# Patient Record
Sex: Male | Born: 1990 | Race: Black or African American | Hispanic: No | Marital: Single | Smoking: Never smoker
Health system: Southern US, Community
[De-identification: ages and names within clinical notes are randomized; demographics above are authoritative.]

---
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
## 2019-05-05 ENCOUNTER — Other Ambulatory Visit: Payer: Self-pay

## 2019-05-05 ENCOUNTER — Emergency Department (HOSPITAL_COMMUNITY)
Admission: EM | Admit: 2019-05-05 | Discharge: 2019-05-05 | Disposition: A | Discharge status: Home / Self Care | Payer: Commercial Managed Care - PPO | Attending: Emergency Medicine | Admitting: Emergency Medicine

## 2019-05-05 ENCOUNTER — Encounter (HOSPITAL_COMMUNITY): Payer: Self-pay | Admitting: Emergency Medicine

## 2019-05-05 ENCOUNTER — Emergency Department (HOSPITAL_COMMUNITY): Payer: Commercial Managed Care - PPO

## 2019-05-05 DIAGNOSIS — B349 Viral infection, unspecified: Secondary | ICD-10-CM | POA: Diagnosis not present

## 2019-05-05 DIAGNOSIS — Z20828 Contact with and (suspected) exposure to other viral communicable diseases: Secondary | ICD-10-CM | POA: Insufficient documentation

## 2019-05-05 DIAGNOSIS — E876 Hypokalemia: Secondary | ICD-10-CM | POA: Insufficient documentation

## 2019-05-05 DIAGNOSIS — N3000 Acute cystitis without hematuria: Secondary | ICD-10-CM | POA: Diagnosis not present

## 2019-05-05 DIAGNOSIS — Z20822 Contact with and (suspected) exposure to covid-19: Secondary | ICD-10-CM

## 2019-05-05 DIAGNOSIS — R1084 Generalized abdominal pain: Secondary | ICD-10-CM | POA: Diagnosis not present

## 2019-05-05 DIAGNOSIS — R509 Fever, unspecified: Secondary | ICD-10-CM | POA: Diagnosis present

## 2019-05-05 LAB — URINALYSIS, ROUTINE W REFLEX MICROSCOPIC
Glucose, UA: NEGATIVE mg/dL
Hgb urine dipstick: NEGATIVE
Ketones, ur: 80 mg/dL — AB
Leukocytes,Ua: NEGATIVE
Nitrite: NEGATIVE
Protein, ur: >=300 mg/dL — AB
Specific Gravity, Urine: 1.041 — ABNORMAL HIGH (ref 1.005–1.030)
pH: 5 (ref 5.0–8.0)

## 2019-05-05 LAB — CBC
HCT: 42.8 % (ref 39.0–52.0)
Hemoglobin: 14.5 g/dL (ref 13.0–17.0)
MCH: 24.5 pg — ABNORMAL LOW (ref 26.0–34.0)
MCHC: 33.9 g/dL (ref 30.0–36.0)
MCV: 72.4 fL — ABNORMAL LOW (ref 80.0–100.0)
Platelets: 108 10*3/uL — ABNORMAL LOW (ref 150–400)
RBC: 5.91 MIL/uL — ABNORMAL HIGH (ref 4.22–5.81)
RDW: 14.5 % (ref 11.5–15.5)
WBC: 3.7 10*3/uL — ABNORMAL LOW (ref 4.0–10.5)
nRBC: 0 % (ref 0.0–0.2)

## 2019-05-05 LAB — COMPREHENSIVE METABOLIC PANEL
ALT: 28 U/L (ref 0–44)
AST: 58 U/L — ABNORMAL HIGH (ref 15–41)
Albumin: 4 g/dL (ref 3.5–5.0)
Alkaline Phosphatase: 54 U/L (ref 38–126)
Anion gap: 13 (ref 5–15)
BUN: 8 mg/dL (ref 6–20)
CO2: 20 mmol/L — ABNORMAL LOW (ref 22–32)
Calcium: 9.1 mg/dL (ref 8.9–10.3)
Chloride: 102 mmol/L (ref 98–111)
Creatinine, Ser: 1.04 mg/dL (ref 0.61–1.24)
GFR calc Af Amer: >60 mL/min (ref >60)
GFR calc non Af Amer: >60 mL/min (ref >60)
Glucose, Bld: 121 mg/dL — ABNORMAL HIGH (ref 70–99)
Potassium: 3.1 mmol/L — ABNORMAL LOW (ref 3.5–5.1)
Sodium: 135 mmol/L (ref 135–145)
Total Bilirubin: 0.8 mg/dL (ref 0.3–1.2)
Total Protein: 7.3 g/dL (ref 6.5–8.1)

## 2019-05-05 LAB — LIPASE, BLOOD: Lipase: 42 U/L (ref 11–51)

## 2019-05-05 LAB — MAGNESIUM: Magnesium: 1.9 mg/dL (ref 1.7–2.4)

## 2019-05-05 LAB — SARS CORONAVIRUS 2 (TAT 6-24 HRS): SARS Coronavirus 2: NEGATIVE

## 2019-05-05 MED ORDER — CEPHALEXIN 500 MG PO CAPS: 500.0000 mg | ORAL_CAPSULE | Freq: Two times a day (BID) | ORAL | 0 refills | Status: AC

## 2019-05-05 MED ORDER — ONDANSETRON HCL 4 MG/2ML IJ SOLN
4.0000 mg | Freq: Once | INTRAMUSCULAR | Status: AC
  Administered 2019-05-05: 4 mg via INTRAVENOUS
  Filled 2019-05-05: qty 2

## 2019-05-05 MED ORDER — SODIUM CHLORIDE 0.9 % IV SOLN
1.0000 g | Freq: Once | INTRAVENOUS | Status: AC
  Administered 2019-05-05: 1 g via INTRAVENOUS
  Filled 2019-05-05: qty 10

## 2019-05-05 MED ORDER — ONDANSETRON 4 MG PO TBDP: 4.0000 mg | ORAL_TABLET | Freq: Three times a day (TID) | ORAL | 0 refills | Status: AC | PRN

## 2019-05-05 MED ORDER — POTASSIUM CHLORIDE CRYS ER 20 MEQ PO TBCR
40.0000 meq | EXTENDED_RELEASE_TABLET | Freq: Once | ORAL | Status: AC
  Administered 2019-05-05: 40 meq via ORAL
  Filled 2019-05-05: qty 2

## 2019-05-05 MED ORDER — ACETAMINOPHEN 325 MG PO TABS
650.0000 mg | ORAL_TABLET | Freq: Once | ORAL | Status: AC
  Administered 2019-05-05: 650 mg via ORAL
  Filled 2019-05-05: qty 2

## 2019-05-05 MED ORDER — LACTATED RINGERS IV BOLUS
1000.0000 mL | Freq: Once | INTRAVENOUS | Status: AC
  Administered 2019-05-05: 1000 mL via INTRAVENOUS

## 2019-05-05 MED ORDER — SODIUM CHLORIDE 0.9% FLUSH: 3.0000 mL | Freq: Once | INTRAVENOUS | Status: DC

## 2019-05-05 MED ORDER — POTASSIUM CHLORIDE CRYS ER 20 MEQ PO TBCR: 20.0000 meq | EXTENDED_RELEASE_TABLET | Freq: Every day | ORAL | 0 refills | Status: AC

## 2019-05-05 MED ORDER — IOHEXOL 300 MG/ML  SOLN
100.0000 mL | Freq: Once | INTRAMUSCULAR | Status: AC
  Administered 2019-05-05: 75 mL via INTRAVENOUS

## 2019-05-05 MED ORDER — HYDROMORPHONE HCL 1 MG/ML IJ SOLN
1.0000 mg | Freq: Once | INTRAMUSCULAR | Status: AC
  Administered 2019-05-05: 1 mg via INTRAVENOUS
  Filled 2019-05-05: qty 1

## 2019-05-05 NOTE — ED Triage Notes (Signed)
Patient reports mid/upper abdominal pain with emesis and diarrhea with low grade fever andchills onset last week .

## 2019-05-05 NOTE — ED Provider Notes (Signed)
[facility] EMERGENCY DEPARTMENT Provider Note   CSN: [id number] Arrival date & time: 05/05/19  0025     History   Chief Complaint Chief Complaint  Patient presents with   Abdominal Pain    HPI Bob Wong is a 28 y.o. male.     HPI  28 year old male presents with fever.  He is concerned about infection with the novel coronavirus.  He states that his symptoms started 3 days ago.  4 days ago he got tested for COVID because he went to urgent care for back pain and he had a low-grade temp of 99.  He states this was negative.  Next day he started having symptoms which includes abdominal pain, diarrhea and fever.  His max temp was 105.  Some sore throat and headache as well as body aches as well.  He vomits occasionally after eating.  The diarrhea is numerous.  Abdominal pain is diffuse.  At times, especially after vomiting he will feel like his body locks up and thinks he is dehydrated.  History reviewed. No pertinent past medical history.  There are no active problems to display for this patient.   History reviewed. No pertinent surgical history.      Home Medications    Prior to Admission medications   Medication Sig Start Date End Date Taking? Authorizing Provider  cephALEXin (KEFLEX) 500 MG capsule Take 1 capsule (500 mg total) by mouth 2 (two) times daily for 7 days. 05/05/19 05/12/19  Pricilla Loveless, MD  ondansetron (ZOFRAN ODT) 4 MG disintegrating tablet Take 1 tablet (4 mg total) by mouth every 8 (eight) hours as needed for nausea or vomiting. 05/05/19   Pricilla Loveless, MD  potassium chloride SA (K-DUR) 20 MEQ tablet Take 1 tablet (20 mEq total) by mouth daily. 05/05/19   Pricilla Loveless, MD    Family History History reviewed. No pertinent family history.  Social History Social History   Tobacco Use   Smoking status: Never Smoker  Substance Use Topics   Alcohol use: Yes   Drug use: Yes    Types: Marijuana     Allergies    Penicillins   Review of Systems Review of Systems  Constitutional: Positive for fever.  Respiratory: Negative for cough and shortness of breath.   Gastrointestinal: Positive for abdominal pain, diarrhea and vomiting.  Genitourinary: Negative for dysuria.  Musculoskeletal: Positive for back pain and myalgias.  Neurological: Positive for headaches.  All other systems reviewed and are negative.    Physical Exam Updated Vital Signs BP 130/80 (BP Location: Right Arm)    Pulse 79    Temp 100 F (37.8 C) (Oral)    Resp 15    SpO2 99%   Physical Exam Vitals signs and nursing note reviewed.  Constitutional:      General: He is not in acute distress.    Appearance: He is well-developed. He is not diaphoretic.  HENT:     Head: Normocephalic and atraumatic.     Right Ear: External ear normal.     Left Ear: External ear normal.     Nose: Nose normal.  Eyes:     General:        Right eye: No discharge.        Left eye: No discharge.  Neck:     Musculoskeletal: Neck supple.  Cardiovascular:     Rate and Rhythm: Normal rate and regular rhythm.  Pulmonary:     Effort: Pulmonary effort is normal.  Abdominal:  Palpations: Abdomen is soft.     Tenderness: There is abdominal tenderness (generalized, worst in LUQ, RLQ).  Skin:    General: Skin is warm and dry.  Neurological:     Mental Status: He is alert.  Psychiatric:        Mood and Affect: Mood is not anxious.      ED Treatments / Results  Labs (all labs ordered are listed, but only abnormal results are displayed) Labs Reviewed  COMPREHENSIVE METABOLIC PANEL - Abnormal; Notable for the following components:      Result Value   Potassium 3.1 (*)    CO2 20 (*)    Glucose, Bld 121 (*)    AST 58 (*)    All other components within normal limits  CBC - Abnormal; Notable for the following components:   WBC 3.7 (*)    RBC 5.91 (*)    MCV 72.4 (*)    MCH 24.5 (*)    Platelets 108 (*)    All other components within  normal limits  URINALYSIS, ROUTINE W REFLEX MICROSCOPIC - Abnormal; Notable for the following components:   Color, Urine AMBER (*)    APPearance HAZY (*)    Specific Gravity, Urine 1.041 (*)    Bilirubin Urine SMALL (*)    Ketones, ur 80 (*)    Protein, ur >=300 (*)    Bacteria, UA RARE (*)    All other components within normal limits  SARS CORONAVIRUS 2 (TAT 6-12 HRS)  LIPASE, BLOOD  MAGNESIUM    EKG None  Radiology Ct Abdomen Pelvis W Contrast  Addendum Date: 05/05/2019   ADDENDUM REPORT: 05/05/2019 13:00 ADDENDUM: Add to IMPRESSION: Mild stranding in the perirectal region. Question a degree of proctitis. Electronically Signed   By: Bretta BangWilliam  Woodruff III M.D.   On: 05/05/2019 13:00   Result Date: 05/05/2019 CLINICAL DATA:  Abdominal pain and fever.  Nausea and vomiting. EXAM: CT ABDOMEN AND PELVIS WITH CONTRAST TECHNIQUE: Multidetector CT imaging of the abdomen and pelvis was performed using the standard protocol following bolus administration of intravenous contrast. CONTRAST:  75 mL OMNIPAQUE IOHEXOL 300 MG/ML  SOLN COMPARISON:  None. FINDINGS: Lower chest: Lung bases are clear. Hepatobiliary: No focal liver lesions are appreciable. Gallbladder wall is not appreciably thickened. There is no biliary duct dilatation. Pancreas: There is no pancreatic mass or inflammatory focus. Spleen: No splenic lesions are evident. Adrenals/Urinary Tract: Adrenals bilaterally appear normal. There is a 4 mm probable cyst in the lower pole left kidney. There is no evident hydronephrosis on either side. There is no evident renal or ureteral calculus on either side. Urinary bladder is midline with wall thickness slightly increased. Stomach/Bowel: There is mild soft tissue stranding in the perirectal region. Rectal wall does not appear overtly thickened. There is fluid in the rectum. Elsewhere, there is no appreciable bowel wall thickening or bowel obstruction. Terminal ileum appears within normal limits. No  evident free air or portal venous air. Vascular/Lymphatic: No abdominal aortic aneurysm. No vascular lesions are evident. No adenopathy is appreciable in the abdomen or pelvis. Reproductive: Prostate and seminal vesicles appear normal in size and configuration. No pelvic mass evident. Other: The appendix is prominent measuring 10 mm in thickness. There are foci of appendiceal wall enhancement. The appendix wall in several sites is slightly indistinct. These are findings concerning for early acute appendiceal inflammation. There is no appendicolith. There is no abnormal fluid or abscess. No perforation evident. No abscess or ascites evident in the abdomen or  pelvis. Musculoskeletal: There is a benign-appearing lesion slightly superior to the right acetabulum with a lucent central area and sclerotic peripheral area measuring 1.8 x 1.4 cm. A small bone island is noted in the right femoral head. No blastic or lytic bone lesions are evident. There is no intramuscular or abdominal wall lesion. IMPRESSION: 1. Findings felt to be indicative of early acute appendiceal inflammation. Appendix: Location: Appendix arises laterally from the cecum and is seen along the superior aspect of the iliac crest. The appendix initially extends superiorly along the lateral cecum and more distally extends somewhat medially. Diameter: 10 mm Appendicolith: None Mucosal hyper-enhancement: Present. Mucosa somewhat indistinct along portions of the appendix. Extraluminal gas: None Periappendiceal collection: None 2. Borderline urinary bladder wall thickening. Question a degree of cystitis. 3.  No bowel obstruction.  No abscess in the abdomen or pelvis. 4.  No renal or ureteral calculi.  No hydronephrosis. 5.  Benign-appearing lesion in the right supra-acetabular region. Critical Value/emergent results were called by telephone at the time of interpretation on 05/05/2019 at 10:46 am to Dr. Pricilla LovelessSCOTT Dimple Bastyr , who verbally acknowledged these results.  Electronically Signed: By: Bretta BangWilliam  Woodruff III M.D. On: 05/05/2019 10:46    Procedures Procedures (including critical care time)  Medications Ordered in ED Medications  sodium chloride flush (NS) 0.9 % injection 3 mL (3 mLs Intravenous Not Given 05/05/19 0901)  acetaminophen (TYLENOL) tablet 650 mg (650 mg Oral Given 05/05/19 0915)  lactated ringers bolus 1,000 mL (0 mLs Intravenous Stopped 05/05/19 1054)  HYDROmorphone (DILAUDID) injection 1 mg (1 mg Intravenous Given 05/05/19 0911)  ondansetron (ZOFRAN) injection 4 mg (4 mg Intravenous Given 05/05/19 0911)  potassium chloride SA (K-DUR) CR tablet 40 mEq (40 mEq Oral Given 05/05/19 0915)  iohexol (OMNIPAQUE) 300 MG/ML solution 100 mL (75 mLs Intravenous Contrast Given 05/05/19 1033)  lactated ringers bolus 1,000 mL (0 mLs Intravenous Stopped 05/05/19 1314)  cefTRIAXone (ROCEPHIN) 1 g in sodium chloride 0.9 % 100 mL IVPB (0 g Intravenous Stopped 05/05/19 1314)     Initial Impression / Assessment and Plan / ED Course  I have reviewed the triage vital signs and the nursing notes.  Pertinent labs & imaging results that were available during my care of the patient were reviewed by me and considered in my medical decision making (see chart for details).        Given the abdominal tenderness, CT obtained.  Shows questionable appendicitis.  General surgery has consulted and after exam and reviewing CT feel this is not appendicitis.  I agree, especially with his other symptoms.  His labs are overall reassuring besides some ketones in the urine indicating dehydration as well as mild hypokalemia.  He was given fluid resuscitation and oral potassium.  His urine is questionable for a UTI and though he does not have symptoms, with the bladder wall thickening, I will treat as if he has cystitis.  More than likely he has overall viral illness and probable COVID.  The COVID swab is currently pending but he has no hypoxia or increased work of breathing or acute  organ injury to necessitate admission.  Will cover with Keflex given remote penicillin allergy but he has had penicillin variance multiple times over the years.  We discussed return precautions but he otherwise appears stable for discharge home.  Bob Wong was evaluated in Emergency Department on 05/05/2019 for the symptoms described in the history of present illness. He was evaluated in the context of the global COVID-19 pandemic, which necessitated  consideration that the patient might be at risk for infection with the SARS-CoV-2 virus that causes COVID-19. Institutional protocols and algorithms that pertain to the evaluation of patients at risk for COVID-19 are in a state of rapid change based on information released by regulatory bodies including the CDC and federal and state organizations. These policies and algorithms were followed during the patient's care in the ED.   Final Clinical Impressions(s) / ED Diagnoses   Final diagnoses:  Viral illness  Suspected Covid-19 Virus Infection  Generalized abdominal pain  Acute cystitis without hematuria  Hypokalemia    ED Discharge Orders         Ordered    cephALEXin (KEFLEX) 500 MG capsule  2 times daily     05/05/19 1319    potassium chloride SA (K-DUR) 20 MEQ tablet  Daily     05/05/19 1319    ondansetron (ZOFRAN ODT) 4 MG disintegrating tablet  Every 8 hours PRN     05/05/19 1319           Sherwood Gambler, MD 05/05/19 1354

## 2019-05-05 NOTE — Consult Note (Signed)
Bob Wong 1990/11/08  [id number].    Requesting MD: Dr. Sherwood Gambler Chief Complaint/Reason for Consult: ? appendicitis  HPI:  This is a  28 yo black male with no PMH who states that he began to have fevers, sore throat, myalgias, N/V/D, and crampy abdominal pain 4 days ago on Saturday.  He denies any known COVID contacts.  He has been having fevers up to 105 at home, but generally around 100-102.  He denies cough, SOB, but admits to intermittent chest pain when his fevers spike.  This is what his abdomen does as well.  When his fevers spike, he develops crampy abdominal pain.  This is mostly in the LUQ, but can be somewhat diffuse and tracks to his lower abdomen as well.  No one specific spot more than others.  He presented to the [facility] ED for evaluation as he was concerned about the possibility of COVID.  He has been found to have leukopenia and thrombocytopenia (mild) with a WBC of 3.4 and plts of 108.  His temp is 102.7.  He underwent a CT scan which revealed possibly appendicitis.  We have been called to see the patient for further evaluation and recommendations.  ROS: ROS: Please see HPI, otherwise all other systems have been reviewed and are negative.  History reviewed. No pertinent family history.  History reviewed. No pertinent past medical history.  History reviewed. No pertinent surgical history.  Social History:  reports that he has never smoked. He does not have any smokeless tobacco history on file. He reports current alcohol use. He reports current drug use. Drug: Marijuana.  Allergies:  Allergies  Allergen Reactions  . Penicillins     (Not in a Wong admission)    Physical Exam: Blood pressure 138/81, pulse 96, temperature (!) 102.7 F (39.3 C), temperature source Oral, resp. rate 15, SpO2 99 %. General: pleasant, WD, WN black male who is laying in bed in NAD, but clearly looks as though he feels unwell. HEENT: head is normocephalic, atraumatic.   Sclera are noninjected.  PERRL.  Ears, nose, and mouth are not visualized due to his mask being in place. Heart: regular, rate, and rhythm per monitor (no contact stethoscope in patient's room.  Due to high suspicion for covid, I did not use mine). Palpable radial and pedal pulses bilaterally Lungs: Respiratory effort nonlabored, O2 sats 98% on room air.  Respirations 15   (no contact stethoscope in patient's room.  Due to high suspicion for covid, I did not use mine) Abd: soft, mild diffuse tenderness, greatest in LUQ.  Minimally tender in RLQ, ND, no peritonitis or guarding, no masses, hernias, or organomegaly MS: all 4 extremities are symmetrical with no cyanosis, clubbing, or edema. Skin: warm and dry with no masses, lesions, or rashes Psych: A&Ox3 with an appropriate affect.   Results for orders placed or performed during the Wong encounter of 05/05/19 (from the past 48 hour(s))  Lipase, blood     Status: None   Collection Time: 05/05/19 12:45 AM  Result Value Ref Range   Lipase 42 11 - 51 U/L    Comment: Performed at [facility], [address] [place], [place] [address]  Comprehensive metabolic panel     Status: Abnormal   Collection Time: 05/05/19 12:45 AM  Result Value Ref Range   Sodium 135 135 - 145 mmol/L   Potassium 3.1 (L) 3.5 - 5.1 mmol/L   Chloride 102 98 - 111 mmol/L   CO2  20 (L) 22 - 32 mmol/L   Glucose, Bld 121 (H) 70 - 99 mg/dL   BUN 8 6 - 20 mg/dL   Creatinine, Ser 4.091.04 0.61 - 1.24 mg/dL   Calcium 9.1 8.9 - 81.110.3 mg/dL   Total Protein 7.3 6.5 - 8.1 g/dL   Albumin 4.0 3.5 - 5.0 g/dL   AST 58 (H) 15 - 41 U/L   ALT 28 0 - 44 U/L   Alkaline Phosphatase 54 38 - 126 U/L   Total Bilirubin 0.8 0.3 - 1.2 mg/dL   GFR calc non Af Amer >60 >60 mL/min   GFR calc Af Amer >60 >60 mL/min   Anion gap 13 5 - 15    Comment: Performed at [facility], [address] [place], [place] [address]  CBC     Status: Abnormal   Collection Time: 05/05/19 12:45 AM   Result Value Ref Range   WBC 3.7 (L) 4.0 - 10.5 K/uL   RBC 5.91 (H) 4.22 - 5.81 MIL/uL   Hemoglobin 14.5 13.0 - 17.0 g/dL   HCT 29.542.8 62.139.0 - 30.852.0 %   MCV 72.4 (L) 80.0 - 100.0 fL   MCH 24.5 (L) 26.0 - 34.0 pg   MCHC 33.9 30.0 - 36.0 g/dL   RDW 65.714.5 84.611.5 - 96.215.5 %   Platelets 108 (L) 150 - 400 K/uL    Comment: REPEATED TO VERIFY PLATELET COUNT CONFIRMED BY SMEAR SPECIMEN CHECKED FOR CLOTS    nRBC 0.0 0.0 - 0.2 %    Comment: Performed at [facility], [address] [place], [place] [address]  Urinalysis, Routine w reflex microscopic     Status: Abnormal   Collection Time: 05/05/19 12:51 AM  Result Value Ref Range   Color, Urine AMBER (A) YELLOW    Comment: BIOCHEMICALS MAY BE AFFECTED BY COLOR   APPearance HAZY (A) CLEAR   Specific Gravity, Urine 1.041 (H) 1.005 - 1.030   pH 5.0 5.0 - 8.0   Glucose, UA NEGATIVE NEGATIVE mg/dL   Hgb urine dipstick NEGATIVE NEGATIVE   Bilirubin Urine SMALL (A) NEGATIVE   Ketones, ur 80 (A) NEGATIVE mg/dL   Protein, ur >=132>=300 (A) NEGATIVE mg/dL   Nitrite NEGATIVE NEGATIVE   Leukocytes,Ua NEGATIVE NEGATIVE   RBC / HPF 0-5 0 - 5 RBC/hpf   WBC, UA 11-20 0 - 5 WBC/hpf   Bacteria, UA RARE (A) NONE SEEN   Squamous Epithelial / LPF 0-5 0 - 5   Mucus PRESENT     Comment: Performed at [facility], [address] [place], [place] [address]  Magnesium     Status: None   Collection Time: 05/05/19  9:15 AM  Result Value Ref Range   Magnesium 1.9 1.7 - 2.4 mg/dL    Comment: Performed at [facility], [address] [place], [place] [address]   Ct Abdomen Pelvis W Contrast  Addendum Date: 05/05/2019   ADDENDUM REPORT: 05/05/2019 13:00 ADDENDUM: Add to IMPRESSION: Mild stranding in the perirectal region. Question a degree of proctitis. Electronically Signed   By: Bretta BangWilliam  Woodruff III M.D.   On: 05/05/2019 13:00   Result Date: 05/05/2019 CLINICAL DATA:  Abdominal pain and fever.  Nausea and vomiting. EXAM: CT ABDOMEN AND PELVIS WITH  CONTRAST TECHNIQUE: Multidetector CT imaging of the abdomen and pelvis was performed using the standard protocol following bolus administration of intravenous contrast. CONTRAST:  75 mL OMNIPAQUE IOHEXOL 300 MG/ML  SOLN COMPARISON:  None. FINDINGS: Lower  chest: Lung bases are clear. Hepatobiliary: No focal liver lesions are appreciable. Gallbladder wall is not appreciably thickened. There is no biliary duct dilatation. Pancreas: There is no pancreatic mass or inflammatory focus. Spleen: No splenic lesions are evident. Adrenals/Urinary Tract: Adrenals bilaterally appear normal. There is a 4 mm probable cyst in the lower pole left kidney. There is no evident hydronephrosis on either side. There is no evident renal or ureteral calculus on either side. Urinary bladder is midline with wall thickness slightly increased. Stomach/Bowel: There is mild soft tissue stranding in the perirectal region. Rectal wall does not appear overtly thickened. There is fluid in the rectum. Elsewhere, there is no appreciable bowel wall thickening or bowel obstruction. Terminal ileum appears within normal limits. No evident free air or portal venous air. Vascular/Lymphatic: No abdominal aortic aneurysm. No vascular lesions are evident. No adenopathy is appreciable in the abdomen or pelvis. Reproductive: Prostate and seminal vesicles appear normal in size and configuration. No pelvic mass evident. Other: The appendix is prominent measuring 10 mm in thickness. There are foci of appendiceal wall enhancement. The appendix wall in several sites is slightly indistinct. These are findings concerning for early acute appendiceal inflammation. There is no appendicolith. There is no abnormal fluid or abscess. No perforation evident. No abscess or ascites evident in the abdomen or pelvis. Musculoskeletal: There is a benign-appearing lesion slightly superior to the right acetabulum with a lucent central area and sclerotic peripheral area measuring 1.8 x  1.4 cm. A small bone island is noted in the right femoral head. No blastic or lytic bone lesions are evident. There is no intramuscular or abdominal wall lesion. IMPRESSION: 1. Findings felt to be indicative of early acute appendiceal inflammation. Appendix: Location: Appendix arises laterally from the cecum and is seen along the superior aspect of the iliac crest. The appendix initially extends superiorly along the lateral cecum and more distally extends somewhat medially. Diameter: 10 mm Appendicolith: None Mucosal hyper-enhancement: Present. Mucosa somewhat indistinct along portions of the appendix. Extraluminal gas: None Periappendiceal collection: None 2. Borderline urinary bladder wall thickening. Question a degree of cystitis. 3.  No bowel obstruction.  No abscess in the abdomen or pelvis. 4.  No renal or ureteral calculi.  No hydronephrosis. 5.  Benign-appearing lesion in the right supra-acetabular region. Critical Value/emergent results were called by telephone at the time of interpretation on 05/05/2019 at 10:46 am to Dr. Pricilla Loveless , who verbally acknowledged these results. Electronically Signed: By: Bretta Bang III M.D. On: 05/05/2019 10:46      Assessment/Plan Fever Leukopenia/thrombocytopenia - more consistent with a viral illness Pharyngitis Myalgias  Abdominal pain, N/V/D The patient's CT scan has been reviewed by myself and Dr. Fredricka Bonine as well as his chart and labs.  The CT scan does show his appendix, however, the tip of the appendix has air present which is NOT consistent with appendicitis.  I would also expect for a patient to being have abdominal pain with N/V/D for 4 days and fevers this high to have a much worse CT than he has but to also have a leukocytosis and pain specifically in the RLQ and not worse in the upper abdomen.  His story, his objective findings, and clinically are not concerning for appendicitis.  I will defer to the ED and possibly medical doctors for  further decisions on his care concerning his COVID test vs some other viral illness.  There are no plans for surgical intervention and do not currently see a need for  empiric abx therapy either.  Discussed with Dr. Criss AlvineGoldston.  Please call if needed.  Letha CapeKelly E Lyrika Souders, [facility] 05/05/2019, 1:04 PM Pager: [phone]

## 2019-05-05 NOTE — Discharge Instructions (Addendum)
If your fever does not improve, you develop uncontrolled vomiting, severe or worsening abdominal pain, coughing blood, trouble breathing, or any other new/concerning symptoms then return to the ER for evaluation.  Your CT scan showed questionable appendicitis though the surgeons do not believe you have appendicitis today given your other symptoms.  If you develop more pain in the right lower abdomen or any other new/concerning symptoms then you should come back to the hospital.  Otherwise see your doctor in the next 1-2 days.

## 2019-05-05 NOTE — ED Notes (Signed)
Pt c/o eratic breathing and dizziness, assessed vitals and are normal. Assisted pt to restroom and back to waiting area

## 2021-09-23 ENCOUNTER — Encounter (HOSPITAL_BASED_OUTPATIENT_CLINIC_OR_DEPARTMENT_OTHER): Payer: Self-pay

## 2021-09-23 ENCOUNTER — Emergency Department (HOSPITAL_BASED_OUTPATIENT_CLINIC_OR_DEPARTMENT_OTHER)
Admission: EM | Admit: 2021-09-23 | Discharge: 2021-09-23 | Disposition: A | Discharge status: Home / Self Care | Payer: BLUE CROSS/BLUE SHIELD | Attending: Emergency Medicine | Admitting: Emergency Medicine

## 2021-09-23 ENCOUNTER — Emergency Department (HOSPITAL_BASED_OUTPATIENT_CLINIC_OR_DEPARTMENT_OTHER): Payer: BLUE CROSS/BLUE SHIELD

## 2021-09-23 ENCOUNTER — Emergency Department (HOSPITAL_BASED_OUTPATIENT_CLINIC_OR_DEPARTMENT_OTHER): Payer: BLUE CROSS/BLUE SHIELD | Admitting: Radiology

## 2021-09-23 ENCOUNTER — Other Ambulatory Visit: Payer: Self-pay

## 2021-09-23 DIAGNOSIS — M25551 Pain in right hip: Secondary | ICD-10-CM | POA: Diagnosis not present

## 2021-09-23 DIAGNOSIS — R519 Headache, unspecified: Secondary | ICD-10-CM | POA: Insufficient documentation

## 2021-09-23 DIAGNOSIS — M542 Cervicalgia: Secondary | ICD-10-CM | POA: Diagnosis not present

## 2021-09-23 DIAGNOSIS — M546 Pain in thoracic spine: Secondary | ICD-10-CM | POA: Diagnosis not present

## 2021-09-23 DIAGNOSIS — M25552 Pain in left hip: Secondary | ICD-10-CM | POA: Diagnosis not present

## 2021-09-23 DIAGNOSIS — M545 Low back pain, unspecified: Secondary | ICD-10-CM | POA: Insufficient documentation

## 2021-09-23 DIAGNOSIS — Y92410 Unspecified street and highway as the place of occurrence of the external cause: Secondary | ICD-10-CM | POA: Diagnosis not present

## 2021-09-23 DIAGNOSIS — M25521 Pain in right elbow: Secondary | ICD-10-CM | POA: Diagnosis not present

## 2021-09-23 DIAGNOSIS — M7918 Myalgia, other site: Secondary | ICD-10-CM

## 2021-09-23 MED ORDER — METHOCARBAMOL 500 MG PO TABS: 500.0000 mg | ORAL_TABLET | Freq: Two times a day (BID) | ORAL | 0 refills | Status: AC

## 2021-09-23 MED ORDER — NAPROXEN 375 MG PO TABS: 375.0000 mg | ORAL_TABLET | Freq: Two times a day (BID) | ORAL | 0 refills | Status: AC

## 2021-09-23 NOTE — ED Notes (Signed)
Patient transported to X-ray 
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
## 2021-09-23 NOTE — Discharge Instructions (Addendum)
Your x-rays did not show any evidence of any broken bones or acute traumatic injuries.  The CT scan of your head was also reassuring.  You may alternate taking Tylenol and Naproxen as needed for pain control. You may take Naproxen twice daily as directed on your discharge paperwork and you may take  [phone] mg of Tylenol every 6 hours. Do not exceed 4000 mg of Tylenol daily as this can lead to liver damage. Also, make sure to take Naproxen with meals as it can cause an upset stomach. Do not take other NSAIDs while taking Naproxen such as (Aleve, Ibuprofen, Aspirin, Celebrex, etc) and do not take more than the prescribed dose as this can lead to ulcers and bleeding in your GI tract. You may use warm and cold compresses to help with your symptoms.   You were given a prescription for Robaxin which is a muscle relaxer.  You should not drive, work, or operate machinery while taking this medication as it can make you very drowsy.    Please follow up with your primary doctor within the next 7-10 days for re-evaluation and further treatment of your symptoms.   Please return to the ER sooner if you have any new or worsening symptoms.
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
## 2023-07-09 ENCOUNTER — Other Ambulatory Visit (HOSPITAL_COMMUNITY): Payer: Self-pay

## 2023-07-09 MED ORDER — AMPHETAMINE-DEXTROAMPHET ER 20 MG PO CP24
20.0000 mg | ORAL_CAPSULE | Freq: Every morning | ORAL | 0 refills | Status: AC
  Filled 2023-07-09: qty 30, 30d supply, fill #0

## 2023-08-06 ENCOUNTER — Other Ambulatory Visit (HOSPITAL_COMMUNITY): Payer: Self-pay

## 2023-08-06 MED ORDER — AMPHETAMINE-DEXTROAMPHET ER 25 MG PO CP24
25.0000 mg | ORAL_CAPSULE | Freq: Every day | ORAL | 0 refills | Status: DC
Start: 2023-08-06 — End: 2023-09-12
  Filled 2023-08-06: qty 30, 30d supply, fill #0

## 2023-08-06 MED ORDER — AMPHETAMINE-DEXTROAMPHET ER 30 MG PO CP24
30.0000 mg | ORAL_CAPSULE | Freq: Every morning | ORAL | 0 refills | Status: DC
  Filled 2023-08-06: qty 30, 30d supply, fill #0

## 2023-09-12 ENCOUNTER — Other Ambulatory Visit (HOSPITAL_COMMUNITY): Payer: Self-pay

## 2023-09-12 MED ORDER — AMPHETAMINE-DEXTROAMPHET ER 30 MG PO CP24
30.0000 mg | ORAL_CAPSULE | Freq: Every morning | ORAL | 0 refills | Status: DC
  Filled 2023-09-12: qty 30, 30d supply, fill #0

## 2023-09-12 MED ORDER — AMPHETAMINE-DEXTROAMPHET ER 25 MG PO CP24
25.0000 mg | ORAL_CAPSULE | Freq: Every day | ORAL | 0 refills | Status: DC
Start: 2023-09-12 — End: 2023-10-08
  Filled 2023-09-12: qty 30, 30d supply, fill #0

## 2023-09-16 ENCOUNTER — Other Ambulatory Visit (HOSPITAL_COMMUNITY): Payer: Self-pay

## 2023-09-16 MED ORDER — HYDROCORTISONE 2.5 % EX CREA
1.0000 | TOPICAL_CREAM | Freq: Two times a day (BID) | CUTANEOUS | 0 refills | Status: AC | PRN
  Filled 2023-09-16: qty 30, 30d supply, fill #0

## 2023-10-08 ENCOUNTER — Other Ambulatory Visit (HOSPITAL_COMMUNITY): Payer: Self-pay

## 2023-10-08 MED ORDER — AMPHETAMINE-DEXTROAMPHET ER 25 MG PO CP24
25.0000 mg | ORAL_CAPSULE | Freq: Every day | ORAL | 0 refills | Status: DC
  Filled 2023-10-16: qty 30, 30d supply, fill #0

## 2023-10-08 MED ORDER — AMPHETAMINE-DEXTROAMPHET ER 30 MG PO CP24
30.0000 mg | ORAL_CAPSULE | Freq: Every morning | ORAL | 0 refills | Status: DC
Start: 2023-10-08 — End: 2023-11-07
  Filled 2023-10-16: qty 30, 30d supply, fill #0

## 2023-10-16 ENCOUNTER — Other Ambulatory Visit (HOSPITAL_COMMUNITY): Payer: Self-pay

## 2023-11-07 ENCOUNTER — Other Ambulatory Visit (HOSPITAL_COMMUNITY): Payer: Self-pay

## 2023-11-07 MED ORDER — AMPHETAMINE-DEXTROAMPHET ER 25 MG PO CP24
25.0000 mg | ORAL_CAPSULE | Freq: Every day | ORAL | 0 refills | Status: DC
  Filled 2023-11-07 – 2023-11-13 (×3): qty 30, 30d supply, fill #0

## 2023-11-07 MED ORDER — AMPHETAMINE-DEXTROAMPHET ER 30 MG PO CP24
30.0000 mg | ORAL_CAPSULE | Freq: Every morning | ORAL | 0 refills | Status: DC
Start: 2023-11-07 — End: 2023-12-18
  Filled 2023-11-07 – 2023-11-13 (×3): qty 30, 30d supply, fill #0

## 2023-11-08 ENCOUNTER — Other Ambulatory Visit (HOSPITAL_COMMUNITY): Payer: Self-pay

## 2023-11-12 ENCOUNTER — Other Ambulatory Visit (HOSPITAL_COMMUNITY): Payer: Self-pay

## 2023-11-13 ENCOUNTER — Other Ambulatory Visit (HOSPITAL_COMMUNITY): Payer: Self-pay

## 2023-12-18 ENCOUNTER — Other Ambulatory Visit (HOSPITAL_COMMUNITY): Payer: Self-pay

## 2023-12-18 MED ORDER — AMPHETAMINE-DEXTROAMPHET ER 30 MG PO CP24
30.0000 mg | ORAL_CAPSULE | Freq: Every morning | ORAL | 0 refills | Status: DC
  Filled 2023-12-18: qty 30, 30d supply, fill #0

## 2023-12-18 MED ORDER — AMPHETAMINE-DEXTROAMPHET ER 25 MG PO CP24
25.0000 mg | ORAL_CAPSULE | Freq: Every day | ORAL | 0 refills | Status: DC
  Filled 2023-12-18: qty 30, 30d supply, fill #0

## 2024-01-17 ENCOUNTER — Other Ambulatory Visit: Payer: Self-pay

## 2024-01-17 ENCOUNTER — Other Ambulatory Visit (HOSPITAL_COMMUNITY): Payer: Self-pay

## 2024-01-17 MED ORDER — AMPHETAMINE-DEXTROAMPHET ER 30 MG PO CP24
30.0000 mg | ORAL_CAPSULE | Freq: Every morning | ORAL | 0 refills | Status: DC
Start: 2024-01-16 — End: 2024-02-13
  Filled 2024-01-17: qty 30, 30d supply, fill #0

## 2024-01-17 MED ORDER — AMPHETAMINE-DEXTROAMPHET ER 25 MG PO CP24
25.0000 mg | ORAL_CAPSULE | Freq: Every day | ORAL | 0 refills | Status: DC
  Filled 2024-01-17: qty 30, 30d supply, fill #0

## 2024-01-21 ENCOUNTER — Other Ambulatory Visit (HOSPITAL_COMMUNITY): Payer: Self-pay

## 2024-01-21 MED ORDER — ASPIRIN 81 MG PO CHEW
81.0000 mg | CHEWABLE_TABLET | Freq: Two times a day (BID) | ORAL | 0 refills | Status: DC
  Filled 2024-01-21: qty 84, 42d supply, fill #0

## 2024-01-21 MED ORDER — ONDANSETRON HCL 4 MG PO TABS
4.0000 mg | ORAL_TABLET | Freq: Three times a day (TID) | ORAL | 0 refills | Status: AC | PRN
  Filled 2024-01-21: qty 30, 10d supply, fill #0

## 2024-01-21 MED ORDER — DOCUSATE SODIUM 50 MG PO CAPS
50.0000 mg | ORAL_CAPSULE | Freq: Two times a day (BID) | ORAL | 0 refills | Status: AC
  Filled 2024-01-21: qty 28, 14d supply, fill #0

## 2024-01-21 MED ORDER — HYDROCODONE-ACETAMINOPHEN 5-325 MG PO TABS
1.0000 | ORAL_TABLET | Freq: Four times a day (QID) | ORAL | 0 refills | Status: AC | PRN
Start: 2024-01-21 — End: ?
  Filled 2024-01-21: qty 20, 5d supply, fill #0

## 2024-02-01 ENCOUNTER — Emergency Department (HOSPITAL_BASED_OUTPATIENT_CLINIC_OR_DEPARTMENT_OTHER)
Admission: EM | Admit: 2024-02-01 | Discharge: 2024-02-01 | Disposition: A | Discharge status: Home / Self Care | Attending: Emergency Medicine | Admitting: Emergency Medicine

## 2024-02-01 ENCOUNTER — Emergency Department (HOSPITAL_BASED_OUTPATIENT_CLINIC_OR_DEPARTMENT_OTHER)

## 2024-02-01 ENCOUNTER — Encounter (HOSPITAL_BASED_OUTPATIENT_CLINIC_OR_DEPARTMENT_OTHER): Payer: Self-pay

## 2024-02-01 ENCOUNTER — Other Ambulatory Visit: Payer: Self-pay

## 2024-02-01 DIAGNOSIS — L03116 Cellulitis of left lower limb: Secondary | ICD-10-CM | POA: Insufficient documentation

## 2024-02-01 DIAGNOSIS — M79662 Pain in left lower leg: Secondary | ICD-10-CM | POA: Diagnosis present

## 2024-02-01 LAB — CBC WITH DIFFERENTIAL/PLATELET
Abs Immature Granulocytes: 0.03 10*3/uL (ref 0.00–0.07)
Basophils Absolute: 0 10*3/uL (ref 0.0–0.1)
Basophils Relative: 0 %
Eosinophils Absolute: 0.2 10*3/uL (ref 0.0–0.5)
Eosinophils Relative: 5 %
HCT: 42.8 % (ref 39.0–52.0)
Hemoglobin: 14.2 g/dL (ref 13.0–17.0)
Immature Granulocytes: 1 %
Lymphocytes Relative: 20 %
Lymphs Abs: 1 10*3/uL (ref 0.7–4.0)
MCH: 26 pg (ref 26.0–34.0)
MCHC: 33.2 g/dL (ref 30.0–36.0)
MCV: 78.2 fL — ABNORMAL LOW (ref 80.0–100.0)
Monocytes Absolute: 0.4 10*3/uL (ref 0.1–1.0)
Monocytes Relative: 7 %
Neutro Abs: 3.3 10*3/uL (ref 1.7–7.7)
Neutrophils Relative %: 67 %
Platelets: 211 10*3/uL (ref 150–400)
RBC: 5.47 MIL/uL (ref 4.22–5.81)
RDW: 14.6 % (ref 11.5–15.5)
WBC: 5 10*3/uL (ref 4.0–10.5)
nRBC: 0 % (ref 0.0–0.2)

## 2024-02-01 LAB — COMPREHENSIVE METABOLIC PANEL WITH GFR
ALT: 16 U/L (ref 0–44)
AST: 20 U/L (ref 15–41)
Albumin: 4.6 g/dL (ref 3.5–5.0)
Alkaline Phosphatase: 138 U/L — ABNORMAL HIGH (ref 38–126)
Anion gap: 11 (ref 5–15)
BUN: 14 mg/dL (ref 6–20)
CO2: 25 mmol/L (ref 22–32)
Calcium: 10.2 mg/dL (ref 8.9–10.3)
Chloride: 100 mmol/L (ref 98–111)
Creatinine, Ser: 1.07 mg/dL (ref 0.61–1.24)
GFR, Estimated: >60 mL/min (ref >60)
Glucose, Bld: 89 mg/dL (ref 70–99)
Potassium: 4.3 mmol/L (ref 3.5–5.1)
Sodium: 136 mmol/L (ref 135–145)
Total Bilirubin: 0.4 mg/dL (ref 0.0–1.2)
Total Protein: 8.3 g/dL — ABNORMAL HIGH (ref 6.5–8.1)

## 2024-02-01 LAB — LACTIC ACID, PLASMA: Lactic Acid, Venous: 1.1 mmol/L (ref 0.5–1.9)

## 2024-02-01 MED ORDER — CEFAZOLIN SODIUM-DEXTROSE 1-4 GM/50ML-% IV SOLN
1.0000 g | Freq: Once | INTRAVENOUS | Status: AC
  Administered 2024-02-01: 1 g via INTRAVENOUS
  Filled 2024-02-01: qty 50

## 2024-02-01 MED ORDER — CEPHALEXIN 500 MG PO CAPS
1000.0000 mg | ORAL_CAPSULE | Freq: Two times a day (BID) | ORAL | 0 refills | Status: AC
Start: 2024-02-01 — End: 2024-02-08

## 2024-02-01 NOTE — ED Notes (Signed)
 Called [place] on the [facility] line [phone] to page on call for Dr. [place] Miller Case

## 2024-02-01 NOTE — ED Provider Notes (Signed)
 Received patient in turnover from Dr. Zackowski.  Please see their note for further details of Hx, PE.  Briefly patient is a 33 y.o. male with a Leg Swelling and Post-op Problem .  Redness and swelling to the medial aspect of the knee.    I discussed case with Hugo Maes on-call for [facility], Dr. Case.  Based on my history and physical exam and my description of his image results and labs recommended starting him on oral antibiotics and would follow him up in the clinic.      Albertus Hughs, DO 02/01/24 [phone]

## 2024-02-01 NOTE — Discharge Instructions (Signed)
 Please return for rapid spreading redness if you develop a fever.  Please follow-up with the orthopedics as scheduled.
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
## 2024-02-01 NOTE — ED Provider Notes (Addendum)
 [place] EMERGENCY DEPARTMENT AT [facility] Provider Note   CSN: [id number] Arrival date & time: 02/01/24  1204     History  Chief Complaint  Patient presents with   Leg Swelling   Post-op Problem    Bob Wong is a 33 y.o. male.  Patient status post meniscal repair done by [facility] on May 13.  Patient had follow-up with their orthopedic group and everything was looking good.  He is in a leg brace.  They supposed to wear most of the time.  He states starting about 4 days ago pain started to increase in the leg.  There is some swelling and tenderness mostly to the medial aspect.  He was worried about a blood clot.  Denies any fevers.  Past medical history noncontributory       Home Medications Prior to Admission medications   Medication Sig Start Date End Date Taking? Authorizing Provider  amphetamine -dextroamphetamine  (ADDERALL XR) 20 MG 24 hr capsule Take 1 capsule (20 mg total) by mouth every morning. 07/09/23     amphetamine -dextroamphetamine  (ADDERALL XR) 25 MG 24 hr capsule Take 1 capsule by mouth daily in the afternoon. 01/16/24     amphetamine -dextroamphetamine  (ADDERALL XR) 30 MG 24 hr capsule Take 1 capsule (30 mg total) by mouth in the morning. 01/16/24     aspirin  81 MG chewable tablet Chew 1 tablet (81 mg total) by mouth 2 (two) times daily. 01/21/24     docusate sodium  (COLACE) 50 MG capsule Take 1 capsule (50 mg total) by mouth 2 (two) times daily for 14 days 01/21/24     HYDROcodone -acetaminophen  (NORCO/VICODIN) 5-325 MG tablet Take 1 tablet by mouth every 6 (six) hours as needed for moderate pain (4-6) or severe pain (7-10). 01/21/24     hydrocortisone  2.5 % cream Apply to affected areas twice daily as needed for up to 2 weeks. 09/16/23     methocarbamol  (ROBAXIN ) 500 MG tablet Take 1 tablet (500 mg total) by mouth 2 (two) times daily. 09/23/21   Couture, Cortni S, PA-C  ondansetron  (ZOFRAN  ODT) 4 MG disintegrating tablet Take 1 tablet (4 mg total) by  mouth every 8 (eight) hours as needed for nausea or vomiting. 05/05/19   Jerilynn Montenegro, MD  ondansetron  (ZOFRAN ) 4 MG tablet Take 1 tablet (4 mg total) by mouth every 8 (eight) hours as needed for nausea or vomiting. 01/21/24     potassium chloride  SA (K-DUR) 20 MEQ tablet Take 1 tablet (20 mEq total) by mouth daily. 05/05/19   Jerilynn Montenegro, MD      Allergies    Penicillins    Review of Systems   Review of Systems  Constitutional:  Negative for chills and fever.  HENT:  Negative for ear pain and sore throat.   Eyes:  Negative for pain and visual disturbance.  Respiratory:  Negative for cough and shortness of breath.   Cardiovascular:  Negative for chest pain and palpitations.  Gastrointestinal:  Negative for abdominal pain and vomiting.  Genitourinary:  Negative for dysuria and hematuria.  Musculoskeletal:  Positive for joint swelling. Negative for arthralgias and back pain.  Skin:  Negative for color change and rash.  Neurological:  Negative for seizures and syncope.  All other systems reviewed and are negative.   Physical Exam Updated Vital Signs BP 123/77 (BP Location: Right Arm)   Pulse 96   Temp 98 F (36.7 C) (Oral)   Resp 18   Ht 1.854 m (6\' 1" )   Wt 81.6  kg   SpO2 100%   BMI 23.75 kg/m  Physical Exam Vitals and nursing note reviewed.  Constitutional:      General: He is not in acute distress.    Appearance: Normal appearance. He is well-developed. He is not ill-appearing.  HENT:     Head: Normocephalic and atraumatic.  Eyes:     Extraocular Movements: Extraocular movements intact.     Conjunctiva/sclera: Conjunctivae normal.     Pupils: Pupils are equal, round, and reactive to light.  Cardiovascular:     Rate and Rhythm: Normal rate and regular rhythm.     Heart sounds: No murmur heard. Pulmonary:     Effort: Pulmonary effort is normal. No respiratory distress.     Breath sounds: Normal breath sounds.  Abdominal:     Palpations: Abdomen is soft.      Tenderness: There is no abdominal tenderness.  Musculoskeletal:        General: Swelling and tenderness present.     Cervical back: Neck supple.     Comments: Left knee with inferior erythema and warmth mostly to the medial side same superiorly.  Not much erythema or any swelling to the lateral side.  Distally dorsalis pedis pulses 2+.  Sensation intact.  Patient's wounds with small bandages still in place.  Skin:    General: Skin is warm and dry.     Capillary Refill: Capillary refill takes less than 2 seconds.  Neurological:     General: No focal deficit present.     Mental Status: He is alert and oriented to person, place, and time.  Psychiatric:        Mood and Affect: Mood normal.     ED Results / Procedures / Treatments   Labs (all labs ordered are listed, but only abnormal results are displayed) Labs Reviewed  COMPREHENSIVE METABOLIC PANEL WITH GFR - Abnormal; Notable for the following components:      Result Value   Total Protein 8.3 (*)    Alkaline Phosphatase 138 (*)    All other components within normal limits  CBC WITH DIFFERENTIAL/PLATELET - Abnormal; Notable for the following components:   MCV 78.2 (*)    All other components within normal limits  CULTURE, BLOOD (ROUTINE X 2)  CULTURE, BLOOD (ROUTINE X 2)  LACTIC ACID, PLASMA  LACTIC ACID, PLASMA  URINALYSIS, W/ REFLEX TO CULTURE (INFECTION SUSPECTED)    EKG None  Radiology US  Venous Img Lower  Left (DVT Study) Result Date: 02/01/2024 CLINICAL DATA:  Recent left medial meniscus repair, left lower leg swelling and pain EXAM: LEFT LOWER EXTREMITY VENOUS DOPPLER ULTRASOUND TECHNIQUE: Gray-scale sonography with graded compression, as well as color Doppler and duplex ultrasound were performed to evaluate the lower extremity deep venous systems from the level of the common femoral vein and including the common femoral, femoral, profunda femoral, popliteal and calf veins including the posterior tibial, peroneal and  gastrocnemius veins when visible. The superficial great saphenous vein was also interrogated. Spectral Doppler was utilized to evaluate flow at rest and with distal augmentation maneuvers in the common femoral, femoral and popliteal veins. COMPARISON:  None Available. FINDINGS: Contralateral Common Femoral Vein: Respiratory phasicity is normal and symmetric with the symptomatic side. No evidence of thrombus. Normal compressibility. Common Femoral Vein: No evidence of thrombus. Normal compressibility, respiratory phasicity and response to augmentation. Saphenofemoral Junction: No evidence of thrombus. Normal compressibility and flow on color Doppler imaging. Profunda Femoral Vein: No evidence of thrombus. Normal compressibility and flow on color Doppler  imaging. Femoral Vein: No evidence of thrombus. Normal compressibility, respiratory phasicity and response to augmentation. Popliteal Vein: No evidence of thrombus. Normal compressibility, respiratory phasicity and response to augmentation. Calf Veins: No evidence of thrombus. Normal compressibility and flow on color Doppler imaging. Superficial Great Saphenous Vein: No evidence of thrombus. Normal compressibility. IMPRESSION: No evidence of deep venous thrombosis. Electronically Signed   By: Melven Stable.  Shick M.D.   On: 02/01/2024 14:42   DG Knee Complete 4 Views Left Result Date: 02/01/2024 CLINICAL DATA:  Swelling postoperative repair May 13 EXAM: LEFT KNEE - COMPLETE 4+ VIEW COMPARISON:  September 29, 2009. FINDINGS: No acute fracture or dislocation. Joint spaces and alignment are maintained. No area of erosion or osseous destruction. No unexpected radiopaque foreign body. Soft tissue edema. No large joint effusion. IMPRESSION: Soft tissue edema without acute fracture or dislocation. Electronically Signed   By: Clancy Crimes M.D.   On: 02/01/2024 13:24    Procedures Procedures    Medications Ordered in ED Medications  ceFAZolin (ANCEF) IVPB 1 g/50 mL premix  (1 g Intravenous New Bag/Given 02/01/24 1335)    ED Course/ Medical Decision Making/ A&P                                 Medical Decision Making Amount and/or Complexity of Data Reviewed Labs: ordered. Radiology: ordered.  Risk Prescription drug management.   Patient left leg with swelling but dominantly medially.  It is around the knee but is mostly above or below still very concerning for possible knee joint infection.  Patient's procedure on the 13th done by [facility] for a meniscus repair.  I will get blood cultures.  Will get labs.  Will start Ancef will get x-ray of the knee and get Doppler studies of the leg to rule out DVT.  But my concern is is a cellulitis or joint infection.  Patient's lactic acid very reassuring at 1.1.  Blood cultures are pending.  CBC white count also normal at 5.0 hemoglobin 14.2 platelets 211 patient's complete metabolic panel liver function tests normal except for alk phos is up at 138 but renal function is normal electrolytes are normal.  Doppler study of the left leg no evidence of deep vein thrombosis no evidence of any superficial saphenous vein thrombosis.  X-ray of the knee subtle soft tissue edema without acute fracture or dislocation.  Specifically no large joint effusion.   Will get a hold of on-call orthopedic surgeon for [facility] in [place].  Patient's surgeon was Dr. [place] Case.  Will discuss with him the findings.  It may just be all cellulitis based on his labs.  But they may want to see him in the emergency department at [facility].   Final Clinical Impression(s) / ED Diagnoses Final diagnoses:  Cellulitis of left lower extremity    Rx / DC Orders ED Discharge Orders     None         Nicklas Barns, MD 02/01/24 1250    Nicklas Barns, MD 02/01/24 1452    Nicklas Barns, MD 02/01/24 1455

## 2024-02-01 NOTE — ED Notes (Addendum)
D&C IV 

## 2024-02-06 LAB — CULTURE, BLOOD (ROUTINE X 2)
Culture: NO GROWTH
Special Requests: ADEQUATE
Special Requests: ADEQUATE

## 2024-02-13 ENCOUNTER — Other Ambulatory Visit (HOSPITAL_COMMUNITY): Payer: Self-pay

## 2024-02-13 MED ORDER — AMPHETAMINE-DEXTROAMPHET ER 30 MG PO CP24
30.0000 mg | ORAL_CAPSULE | Freq: Every morning | ORAL | 0 refills | Status: DC
  Filled 2024-02-18: qty 30, 30d supply, fill #0

## 2024-02-13 MED ORDER — AMPHETAMINE-DEXTROAMPHET ER 25 MG PO CP24
25.0000 mg | ORAL_CAPSULE | Freq: Every day | ORAL | 0 refills | Status: DC
  Filled 2024-02-18: qty 30, 30d supply, fill #0

## 2024-02-18 ENCOUNTER — Other Ambulatory Visit (HOSPITAL_COMMUNITY): Payer: Self-pay

## 2024-02-20 ENCOUNTER — Other Ambulatory Visit (HOSPITAL_COMMUNITY): Payer: Self-pay

## 2024-03-24 ENCOUNTER — Other Ambulatory Visit (HOSPITAL_COMMUNITY): Payer: Self-pay

## 2024-03-24 MED ORDER — AMPHETAMINE-DEXTROAMPHET ER 30 MG PO CP24
30.0000 mg | ORAL_CAPSULE | Freq: Every morning | ORAL | 0 refills | Status: DC
  Filled 2024-03-24: qty 30, 30d supply, fill #0

## 2024-03-24 MED ORDER — AMPHETAMINE-DEXTROAMPHET ER 25 MG PO CP24
25.0000 mg | ORAL_CAPSULE | Freq: Every day | ORAL | 0 refills | Status: DC
  Filled 2024-03-24: qty 30, 30d supply, fill #0

## 2024-04-20 ENCOUNTER — Other Ambulatory Visit (HOSPITAL_COMMUNITY): Payer: Self-pay

## 2024-04-20 MED ORDER — AMPHETAMINE-DEXTROAMPHET ER 25 MG PO CP24
25.0000 mg | ORAL_CAPSULE | Freq: Every day | ORAL | 0 refills | Status: DC
  Filled 2024-04-20 – 2024-04-22 (×3): qty 30, 30d supply, fill #0

## 2024-04-20 MED ORDER — AMPHETAMINE-DEXTROAMPHET ER 30 MG PO CP24
30.0000 mg | ORAL_CAPSULE | Freq: Every morning | ORAL | 0 refills | Status: DC
  Filled 2024-04-20 – 2024-04-22 (×3): qty 30, 30d supply, fill #0

## 2024-04-22 ENCOUNTER — Other Ambulatory Visit (HOSPITAL_COMMUNITY): Payer: Self-pay

## 2024-06-02 ENCOUNTER — Other Ambulatory Visit (HOSPITAL_COMMUNITY): Payer: Self-pay

## 2024-06-02 MED ORDER — AMPHETAMINE-DEXTROAMPHET ER 30 MG PO CP24
30.0000 mg | ORAL_CAPSULE | Freq: Every morning | ORAL | 0 refills | Status: DC
  Filled 2024-06-02: qty 30, 30d supply, fill #0

## 2024-06-02 MED ORDER — AMPHETAMINE-DEXTROAMPHET ER 25 MG PO CP24
25.0000 mg | ORAL_CAPSULE | Freq: Every day | ORAL | 0 refills | Status: DC
  Filled 2024-06-02: qty 30, 30d supply, fill #0

## 2024-08-24 ENCOUNTER — Other Ambulatory Visit (HOSPITAL_COMMUNITY): Payer: Self-pay

## 2024-08-24 MED ORDER — AMPHETAMINE-DEXTROAMPHET ER 30 MG PO CP24
30.0000 mg | ORAL_CAPSULE | Freq: Every morning | ORAL | 0 refills | Status: DC
  Filled 2024-08-24 – 2024-09-04 (×2): qty 30, 30d supply, fill #0

## 2024-08-24 MED ORDER — AMPHETAMINE-DEXTROAMPHET ER 25 MG PO CP24
25.0000 mg | ORAL_CAPSULE | Freq: Every day | ORAL | 0 refills | Status: DC
  Filled 2024-08-24 – 2024-09-04 (×2): qty 30, 30d supply, fill #0

## 2024-09-04 ENCOUNTER — Other Ambulatory Visit (HOSPITAL_COMMUNITY): Payer: Self-pay

## 2024-10-02 ENCOUNTER — Other Ambulatory Visit (HOSPITAL_COMMUNITY): Payer: Self-pay

## 2024-10-02 MED ORDER — AMPHETAMINE-DEXTROAMPHET ER 25 MG PO CP24
25.0000 mg | ORAL_CAPSULE | Freq: Every day | ORAL | 0 refills | Status: AC
  Filled 2024-10-02: qty 19, 19d supply, fill #0
  Filled 2024-10-02: qty 11, 11d supply, fill #0

## 2024-10-02 MED ORDER — AMPHETAMINE-DEXTROAMPHET ER 30 MG PO CP24
30.0000 mg | ORAL_CAPSULE | Freq: Every morning | ORAL | 0 refills | Status: AC
  Filled 2024-10-02: qty 30, 30d supply, fill #0

## 2024-10-14 ENCOUNTER — Other Ambulatory Visit (HOSPITAL_COMMUNITY): Payer: Self-pay
